# Patient Record
Sex: Male | Born: 1960 | Race: Black or African American | Hispanic: No | Marital: Single | Smoking: Current every day smoker
Health system: Southern US, Community
[De-identification: ages and names within clinical notes are randomized; demographics above are authoritative.]

## PROBLEM LIST (undated history)

## (undated) HISTORY — PX: NO PAST SURGERIES: SHX2092

---

## 2000-12-15 ENCOUNTER — Encounter: Payer: Self-pay | Admitting: Infectious Diseases

## 2000-12-15 ENCOUNTER — Ambulatory Visit (HOSPITAL_COMMUNITY): Admission: RE | Admit: 2000-12-15 | Discharge: 2000-12-15 | Payer: Self-pay | Admitting: Infectious Diseases

## 2014-09-26 ENCOUNTER — Ambulatory Visit: Payer: Self-pay | Admitting: Physician Assistant

## 2017-09-29 ENCOUNTER — Encounter: Payer: Self-pay | Admitting: Podiatry

## 2017-09-29 ENCOUNTER — Ambulatory Visit (INDEPENDENT_AMBULATORY_CARE_PROVIDER_SITE_OTHER): Payer: 59

## 2017-09-29 ENCOUNTER — Ambulatory Visit (INDEPENDENT_AMBULATORY_CARE_PROVIDER_SITE_OTHER): Payer: 59 | Admitting: Podiatry

## 2017-09-29 ENCOUNTER — Other Ambulatory Visit: Payer: Self-pay | Admitting: Podiatry

## 2017-09-29 DIAGNOSIS — G5791 Unspecified mononeuropathy of right lower limb: Secondary | ICD-10-CM | POA: Diagnosis not present

## 2017-09-29 DIAGNOSIS — M7741 Metatarsalgia, right foot: Secondary | ICD-10-CM | POA: Diagnosis not present

## 2017-09-29 DIAGNOSIS — M779 Enthesopathy, unspecified: Secondary | ICD-10-CM

## 2017-09-29 MED ORDER — GABAPENTIN 100 MG PO CAPS: 100.0000 mg | ORAL_CAPSULE | Freq: Two times a day (BID) | ORAL | 1 refills | Status: DC

## 2017-09-29 NOTE — Progress Notes (Signed)
   Subjective:    Patient ID: Stanley Leveringavid Lee Fedder Jr., male    DOB: April 05, 1961, 57 y.o.   MRN: [id number]  HPI    Review of Systems  All other systems reviewed and are negative.      Objective:   Physical Exam        Assessment & Plan:

## 2017-09-30 NOTE — Progress Notes (Signed)
HPI: 57 year old male presenting today as a new patient with a chief complaint of shooting pain to the right great toe and right foot that began 2-3 weeks ago. He reports associated numbness to the foot. He wears steel-toed shoes which exacerbates the pain. He has not done anything to treat the symptoms. Patient is here for further evaluation and treatment.   No past medical history on file.   Physical Exam: General: The patient is alert and oriented x3 in no acute distress.  Dermatology: Skin is warm, dry and supple bilateral lower extremities. Negative for open lesions or macerations.  Vascular: Palpable pedal pulses bilaterally. No edema or erythema noted. Capillary refill within normal limits.  Neurological: Numbness with paresthesia noted to right medial foot.   Musculoskeletal Exam: Range of motion within normal limits to all pedal and ankle joints bilateral. Muscle strength 5/5 in all groups bilateral.   Radiographic Exam:  Normal osseous mineralization. Joint spaces preserved. No fracture/dislocation/boney destruction.    Assessment: - Neuritis right forefoot   Plan of Care:  - Patient evaluated. X-Rays reviewed.  - offloading met pads dispensed.  - prescription for gabapentin 100 mg #60 provided to patient.  - Return to clinic in 4 weeks.   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     Edrick Kins, DPM [facility]  Dr. Edrick Kins, DPM    [address], [place] [address]                Office [phone]  Fax [phone]

## 2017-10-01 ENCOUNTER — Encounter: Payer: Self-pay | Admitting: Podiatry

## 2017-10-01 NOTE — Progress Notes (Signed)
Patient called on call for concern for "infection" his foot. Saw Dr. Logan BoresEvans a few days ago and reports worsening symptoms since then. Reports sharp pain in his foot. Reports swelling in his toes. Endorses numbness in the foot. Denies any open wounds. Denies nausea vomiting fevers and chills. States that the pain wakes him up at night.   Advised that in my opinion antibiotics indicated at this time. Will facilitate appointment for a n earlier follow up. Advised to call the office should his symptoms worsen, he notice any open wounds, he develops nausea vomiting fevers or chills.

## 2017-10-02 ENCOUNTER — Telehealth: Payer: Self-pay | Admitting: *Deleted

## 2017-10-02 ENCOUNTER — Telehealth: Payer: Self-pay | Admitting: Podiatry

## 2017-10-02 MED ORDER — MELOXICAM 15 MG PO TABS: 15.0000 mg | ORAL_TABLET | Freq: Every day | ORAL | 3 refills | Status: DC

## 2017-10-02 MED ORDER — METHYLPREDNISOLONE 4 MG PO TABS: ORAL_TABLET | ORAL | 0 refills | Status: DC

## 2017-10-02 NOTE — Telephone Encounter (Signed)
I spoke with pt and he states he is taking the pills, but it is not getting any relief. I told pt Dr. Logan BoresEvans is out of the office next week and I would like to get him in to [facility] early next week. Transferred pt to schedulers.

## 2017-10-02 NOTE — Telephone Encounter (Signed)
Returned call to patient and informed him that Dr. Logan BoresEvans wants him to try a Medrol dose pack and Meloxicam to help calm down inflammation. I informed him that if no better after treatment, he can call back and we can look at other treatment options.  Patient voice understanding.   Scripts have been sent to pharmacy.

## 2017-10-02 NOTE — Telephone Encounter (Signed)
-----   Message from Park LiterMichael J Price, DPM sent at 10/01/2017  8:49 PM EDT ----- Regarding: earlier appt Can we call patient to check on him tomorrow and to get him a sooner appointment with Dr. Logan BoresEvans next week?  Thanks!

## 2017-10-02 NOTE — Telephone Encounter (Signed)
Patient called the office stating he is having severe shooting pains in his foot, just saw Dr. Logan BoresEvans Tuesday but the medication he was given is not helping with his pain. Pt would like something else called into the pharmacy or wants to know if he needs to be seen again? Please call.

## 2017-10-02 NOTE — Addendum Note (Signed)
Addended by: Geraldine ContrasVENABLE, Alveta Quintela D on: 10/02/2017 01:51 PM   Modules accepted: Orders

## 2017-10-07 ENCOUNTER — Ambulatory Visit (INDEPENDENT_AMBULATORY_CARE_PROVIDER_SITE_OTHER): Payer: 59 | Admitting: Podiatry

## 2017-10-07 ENCOUNTER — Encounter: Payer: Self-pay | Admitting: Podiatry

## 2017-10-07 DIAGNOSIS — M7741 Metatarsalgia, right foot: Secondary | ICD-10-CM | POA: Diagnosis not present

## 2017-10-07 DIAGNOSIS — G5791 Unspecified mononeuropathy of right lower limb: Secondary | ICD-10-CM | POA: Diagnosis not present

## 2017-10-08 NOTE — Progress Notes (Signed)
Subjective:   Patient ID: Stanley Leveringavid Lee Luth Jr., male   DOB: 57 y.o.   MRN: [id number]   HPI- Patient presents stating that he was concerned because he continues to get tingling in his right big toe and into the arch and he just started taking the gabapentin finish the Medrol Dosepak    ROS      Objective:  Physical Exam  Neurovascular status intact with patient's right foot showing tingling symptoms but no indications of inflammatory condition or any form of hallux limitus condition     Assessment:  Appears to be more of an inflammatory condition with the possibility of mild neuropathy     Plan:  Reviewed his medications and I do think he needs to take his gabapentin for a longer period of time before we will see effect.  I have also asked him to double it up at night and will keep regular appointment with Dr. Logan BoresEvans

## 2017-10-14 DIAGNOSIS — G5791 Unspecified mononeuropathy of right lower limb: Secondary | ICD-10-CM | POA: Diagnosis not present

## 2017-10-16 DIAGNOSIS — R030 Elevated blood-pressure reading, without diagnosis of hypertension: Secondary | ICD-10-CM | POA: Diagnosis not present

## 2017-10-27 ENCOUNTER — Ambulatory Visit: Payer: 59 | Admitting: Podiatry

## 2017-11-27 ENCOUNTER — Telehealth: Payer: Self-pay | Admitting: *Deleted

## 2017-11-27 MED ORDER — GABAPENTIN 100 MG PO CAPS: ORAL_CAPSULE | ORAL | 5 refills | Status: DC

## 2017-11-27 NOTE — Telephone Encounter (Signed)
Refill request for gabapentin  bid. Dr. Charlsie Merles ordered on 10/07/2017 for pt to take Gabapentin  one tablet bid and 2 tablets ( ) at bedtime. Dr. Charlsie Merles ordered refill for #90, +5refills.

## 2017-11-27 NOTE — Addendum Note (Signed)
Addended by: Alphia Kava D on: 11/27/2017 09:43 AM   Modules accepted: Orders

## 2018-11-03 DIAGNOSIS — L97521 Non-pressure chronic ulcer of other part of left foot limited to breakdown of skin: Secondary | ICD-10-CM | POA: Diagnosis not present

## 2018-11-03 DIAGNOSIS — M2042 Other hammer toe(s) (acquired), left foot: Secondary | ICD-10-CM | POA: Diagnosis not present

## 2018-11-03 DIAGNOSIS — M898X9 Other specified disorders of bone, unspecified site: Secondary | ICD-10-CM | POA: Diagnosis not present

## 2019-08-16 ENCOUNTER — Ambulatory Visit: Payer: BC Managed Care – PPO | Attending: Internal Medicine

## 2019-08-16 DIAGNOSIS — Z20822 Contact with and (suspected) exposure to covid-19: Secondary | ICD-10-CM

## 2019-08-17 LAB — NOVEL CORONAVIRUS, NAA: SARS-CoV-2, NAA: NOT DETECTED

## 2019-10-09 ENCOUNTER — Ambulatory Visit: Payer: BC Managed Care – PPO | Attending: Internal Medicine

## 2019-10-09 DIAGNOSIS — Z23 Encounter for immunization: Secondary | ICD-10-CM

## 2019-10-09 NOTE — Progress Notes (Signed)
   [id number]  Name:  Stanley Barajas.    MRN: [id number] DOB: Apr 10, 1961  10/09/2019  Stanley Barajas was observed post Covid-19 immunization for 15 minutes without incident. He was provided with Vaccine Information Sheet and instruction to access the V-Safe system.   Stanley Barajas was instructed to call 911 with any severe reactions post vaccine: Marland Kitchen Difficulty breathing  . Swelling of face and throat  . A fast heartbeat  . A bad rash all over body  . Dizziness and weakness   Immunizations Administered    Name Date Dose VIS Date Route   Pfizer COVID-19 Vaccine 10/09/2019  9:07 AM 0.3 mL 07/01/2019 Intramuscular   Manufacturer: [employer], [employer]   Lot: [id number]   NDC: [address]-2

## 2019-10-30 ENCOUNTER — Ambulatory Visit: Payer: BC Managed Care – PPO | Attending: Internal Medicine

## 2019-10-30 DIAGNOSIS — Z23 Encounter for immunization: Secondary | ICD-10-CM

## 2019-10-30 NOTE — Progress Notes (Signed)
   [id number]  Name:  Stanley Barajas.    MRN: [id number] DOB: 01/12/1961  10/30/2019  Mr. Stanley Barajas was observed post Covid-19 immunization for 15 minutes without incident. He was provided with Vaccine Information Sheet and instruction to access the V-Safe system.   Mr. Stanley Barajas was instructed to call 911 with any severe reactions post vaccine: Marland Kitchen Difficulty breathing  . Swelling of face and throat  . A fast heartbeat  . A bad rash all over body  . Dizziness and weakness   Immunizations Administered    Name Date Dose VIS Date Route   Pfizer COVID-19 Vaccine 10/30/2019  9:03 AM 0.3 mL 07/01/2019 Intramuscular   Manufacturer: [employer], [employer]   Lot: [id number]   NDC: [address]-2

## 2019-10-31 ENCOUNTER — Ambulatory Visit
Admission: EM | Admit: 2019-10-31 | Discharge: 2019-10-31 | Disposition: A | Discharge status: Home / Self Care | Payer: BC Managed Care – PPO | Attending: Urgent Care | Admitting: Urgent Care

## 2019-10-31 ENCOUNTER — Other Ambulatory Visit: Payer: Self-pay

## 2019-10-31 ENCOUNTER — Ambulatory Visit (INDEPENDENT_AMBULATORY_CARE_PROVIDER_SITE_OTHER): Payer: BC Managed Care – PPO

## 2019-10-31 ENCOUNTER — Encounter: Payer: Self-pay | Admitting: Emergency Medicine

## 2019-10-31 DIAGNOSIS — M79642 Pain in left hand: Secondary | ICD-10-CM

## 2019-10-31 DIAGNOSIS — S62617A Displaced fracture of proximal phalanx of left little finger, initial encounter for closed fracture: Secondary | ICD-10-CM

## 2019-10-31 DIAGNOSIS — W19XXXA Unspecified fall, initial encounter: Secondary | ICD-10-CM | POA: Diagnosis not present

## 2019-10-31 MED ORDER — HYDROCODONE-ACETAMINOPHEN 5-325 MG PO TABS: 1.0000 | ORAL_TABLET | Freq: Three times a day (TID) | ORAL | 0 refills | Status: AC | PRN

## 2019-10-31 NOTE — ED Triage Notes (Signed)
Pt c/o left pinky pain, swelling and bruising. He states that he tripped and fell about 3 days ago. He states that he "popped" it back in.

## 2019-10-31 NOTE — Discharge Instructions (Addendum)
It was very nice seeing you today in clinic. Thank you for entrusting me with your care.   Rest, ice, and elevate hand. Wear finger splint until seen by orthopedics.   Make arrangements to follow up with orthopedic doctor. I have provided you the name and office contact information for an excellent local provider. If your symptoms/condition worsens, please seek follow up care either here or in the ER. Please remember, our [facility] providers are "right here with you" when you need [place].   Again, it was my pleasure to take care of you today. Thank you for choosing our clinic. I hope that you start to feel better quickly.   Quentin Mulling, MSN, APRN, FNP-C, CEN Advanced Practice Provider [facility]

## 2019-10-31 NOTE — ED Provider Notes (Signed)
[place], [place]   Name: Julie Paolini. DOB: 11/27/1960 MRN: [id number] CSN: [id number] PCP: [facility] date and time:  10/31/19 1113  Chief Complaint:  Finger Injury  NOTE: Prior to seeing the patient today, I have reviewed the triage nursing documentation and vital signs. Clinical staff has updated patient's PMH/PSHx, current medication list, and drug allergies/intolerances to ensure comprehensive history available to assist in medical decision making.   History:   HPI: Maya Scholer. is a 59 y.o. male who presents today with complaints of pain in the 5th digit of his LEFT hand following a mechanical fall that occurred approximately 3 days ago. Patient describes that injury occurred when he tripped on some uneven pavement causing him to fall to the ground. Fall resulted in obvious dislocation of the finger pr his report. Patient states, "I had to pop it back in place". Since his fall, patient reports continue pain, swelling, and ecchymosis in the digit and lateral (ulnar) aspect of his hand. Patient has limited ROM and intact sensation. He has a PMH (+) for remote hand fracture, none of which required surgical intervention. In efforts to conservatively manage his symptoms at home, the patient notes that he has used APAP, which has not helped to improve his symptoms.   History reviewed. No pertinent past medical history.  Past Surgical History:  Procedure Laterality Date  . NO PAST SURGERIES      Family History  Problem Relation Age of Onset  . Heart failure Mother   . Cancer Father     Social History   Tobacco Use  . Smoking status: Current Every Day Smoker    Packs/day: 0.25    Types: Cigarettes  . Smokeless tobacco: Never Used  Substance Use Topics  . Alcohol use: Yes    Alcohol/week: 4.0 - 5.0 standard drinks    Types: 4 - 5 Cans of beer per week  . Drug use: No    There are no problems to display for this patient.   Home  Medications:    No outpatient medications have been marked as taking for the 10/31/19 encounter [facility] Encounter).    Allergies:   Patient has no known allergies.  Review of Systems (ROS):  Review of systems NEGATIVE unless otherwise noted in narrative H&P section.   Vital Signs: Today's Vitals   10/31/19 1125 10/31/19 1126 10/31/19 1129  BP:   135/88  Pulse:   99  Resp:   18  Temp:   98.3 F (36.8 C)  TempSrc:   Oral  SpO2:   99%  Weight:  180 lb (81.6 kg)   Height:  5\' 11"  (1.803 m)   PainSc: 8       Physical Exam: Physical Exam  Constitutional: He is oriented to person, place, and time and well-developed, well-nourished, and in no distress.  HENT:  Head: Normocephalic and atraumatic.  Eyes: Pupils are equal, round, and reactive to light.  Cardiovascular: Normal rate, regular rhythm, normal heart sounds and intact distal pulses.  Pulmonary/Chest: Effort normal and breath sounds normal.  Musculoskeletal:     Left hand: Swelling and bony tenderness present. No deformity. Decreased range of motion (2/2 pain and swelling). Normal strength. Normal sensation. There is no disruption of two-point discrimination.       Hands:     Comments: Pain, swelling, and ecchymosis noted over the medial third of the LEFT 5th digit. ROM is limited due to pain and swelling. Sensation, temperature, and  capillary refill all WNL.   Neurological: He is alert and oriented to person, place, and time. Gait normal.  Skin: Skin is warm and dry. No rash noted. He is not diaphoretic.  Psychiatric: Mood, memory, affect and judgment normal.  Nursing note and vitals reviewed.   Urgent Care Treatments / Results:   Orders Placed This Encounter  Procedures  . DG Hand Complete Left  . Apply finger splint static    LABS: PLEASE NOTE: all labs that were ordered this encounter are listed, however only abnormal results are displayed. Labs Reviewed - No data to display  EKG: -None  RADIOLOGY: DG  Hand Complete Left  Result Date: 10/31/2019 CLINICAL DATA:  Pain and swelling fifth finger post fall 2 nights ago. EXAM: LEFT HAND - COMPLETE 3+ VIEW COMPARISON:  None. FINDINGS: There is an oblique slightly comminuted minimally displaced fracture involving the fifth middle phalanx slight dorsal angulation of the distal fragment. IMPRESSION: Minimally displaced and slightly comminuted fracture involving the fifth middle phalanx. Electronically Signed   By: Elberta Fortis M.D.   On: 10/31/2019 11:47    PROCEDURES: Procedures  MEDICATIONS RECEIVED THIS VISIT: Medications - No data to display  PERTINENT CLINICAL COURSE NOTES/UPDATES:   Initial Impression / Assessment and Plan / Urgent Care Course:  Pertinent labs & imaging results that were available during my care of the patient were personally reviewed by me and considered in my medical decision making (see lab/imaging section of note for values and interpretations).  Dalen [place]enn[place]. is a 59 y.o. male who presents to [facility] today with complaints of Finger Injury  Patient is well appearing overall in clinic today. He does not appear to be in any acute distress. Presenting symptoms (see HPI) and exam as documented above. Exam concerning for fracture based on appearance and MOI. Patient reports obvious dislocation at time of injury that he "popped back in". Will pursue further evaluation as follows:   Diagnostic radiographs of the LEFT hand revealed a minimally displaced and slightly comminuted fracture of the fifth middle phalanx.   Results reviewed with patient. OCL splint applied by clinic CMA in efforts to immobilize the digit. Discussed that patient needs to be seen for further evaluation by orthopedics. I took the liberty of calling [facility] orthopedics while the patient was in the office to secure him an appointment. Receptionist (Patty) to speak with orthopedist on call and contact the patient directly for an  appointment.  Current clinical condition warrants patient being out of work in order to recover from his current injury/illness. He was provided with the appropriate documentation to provide to his place of employment that will allow for him to RTW on 11/04/2019 as long as splint remains in place. Discussed that he will need to wear the finger splint until directed otherwise by orthopedic provider.   I have reviewed the follow up and strict return precautions for any new or worsening symptoms. Patient is aware of symptoms that would be deemed urgent/emergent, and would thus require further evaluation either here or in the emergency department. At the time of discharge, he verbalized understanding and consent with the discharge plan as it was reviewed with him. All questions were fielded by provider and/or clinic staff prior to patient discharge.    Final Clinical Impressions / Urgent Care Diagnoses:   Final diagnoses:  Displaced fracture of proximal phalanx of left little finger, initial encounter for closed fracture    New Prescriptions:  [place] Controlled Substance Registry  consulted? Yes, I have consulted the [place] Controlled Substances Registry for this patient, and feel the risk/benefit ratio today is favorable for proceeding with this prescription for a controlled substance.  . Discussed use of controlled substance medication to treat his acute pain.  o Reviewed [place] STOP Act regulations  o Clinic does not refill controlled substances over the phone without face to face evaluation.  . Safety precautions reviewed.  o Medications should not be bitten, chewed, sold, or taken with alcohol.  o Avoid use while working, driving, or operating heavy machinery.  o Side effects associated with the use of this particular medication reviewed. - Patient understands that this medication can cause CNS depression, increase his risk of falls, and even lead to overdose that may result in death, if used outside of  the parameters that he and I discussed.  With all of this in mind, he knowingly accepts the risks and responsibilities associated with intended course of treatment, and elects to responsibly proceed as discussed.  Meds ordered this encounter  Medications  . HYDROcodone-acetaminophen (NORCO) 5-325 MG tablet    Sig: Take 1 tablet by mouth 3 (three) times daily as needed for moderate pain.    Dispense:  12 tablet    Refill:  0    Recommended Follow up Care:  Patient encouraged to follow up with the following provider within the specified time frame, or sooner as dictated by the severity of his symptoms. As always, he was instructed that for any urgent/emergent care needs, he should seek care either here or in the emergency department for more immediate evaluation.  Follow-up Information    [facility]   Why: Need to see orthopedics. I have asked that they call you with an appointment. Contact information: [address] [place] [address] [phone]         NOTE: This note was prepared using Dragon dictation software along with smaller phrase technology. Despite my best ability to proofread, there is the potential that transcriptional errors may still occur from this process, and are completely unintentional.    Verlee Monte, NP 10/31/19 1425

## 2020-12-07 IMAGING — CR DG HAND COMPLETE 3+V*L*
3 series · 4 of 4 positions shown · non-contrast
Comparison: None.

CLINICAL DATA: Pain and swelling fifth finger post fall 2 nights
ago.

EXAM:
LEFT HAND - COMPLETE 3+ VIEW

[hand ap]
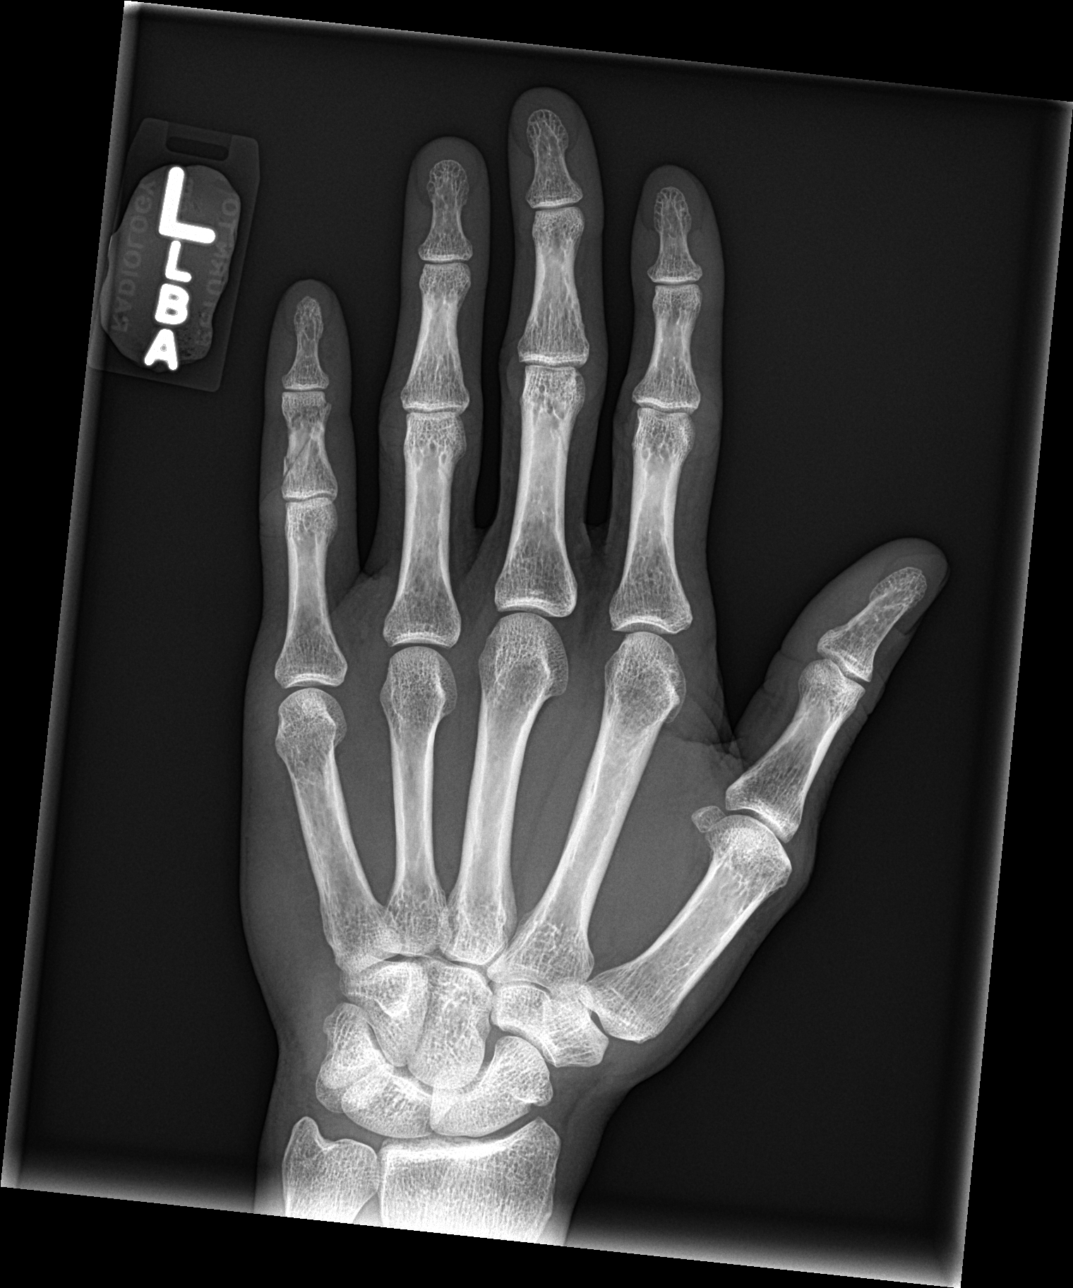

[hand obl]
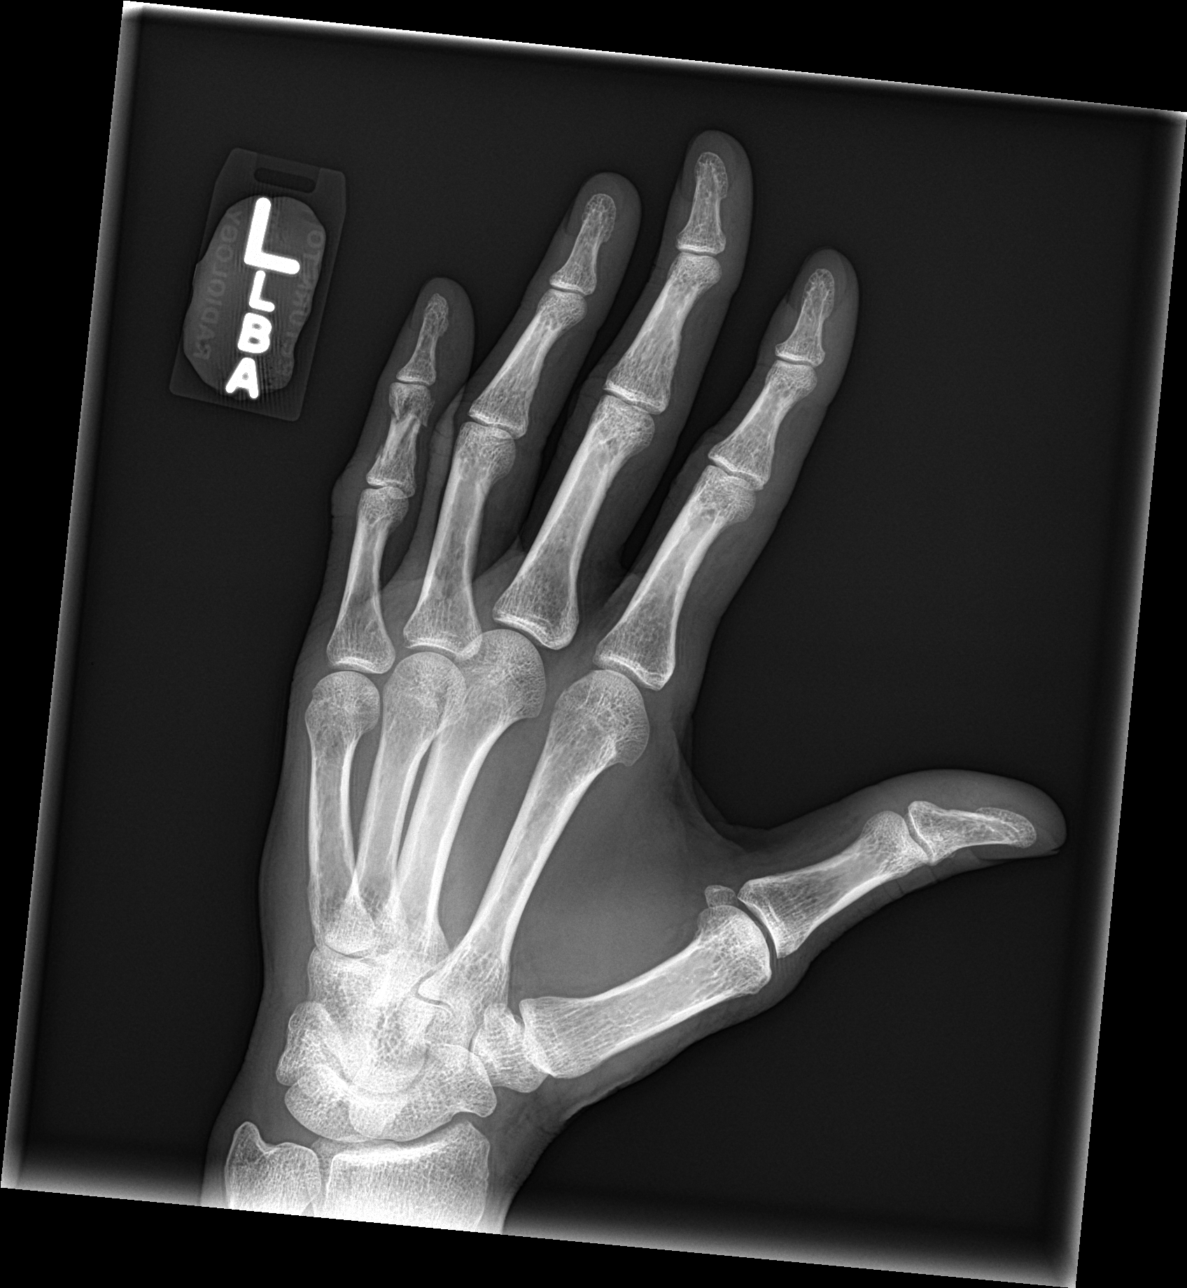

[Series 3: hand lat · 0.14mm/px · 2 of 2 slices shown]
[im 1/2]
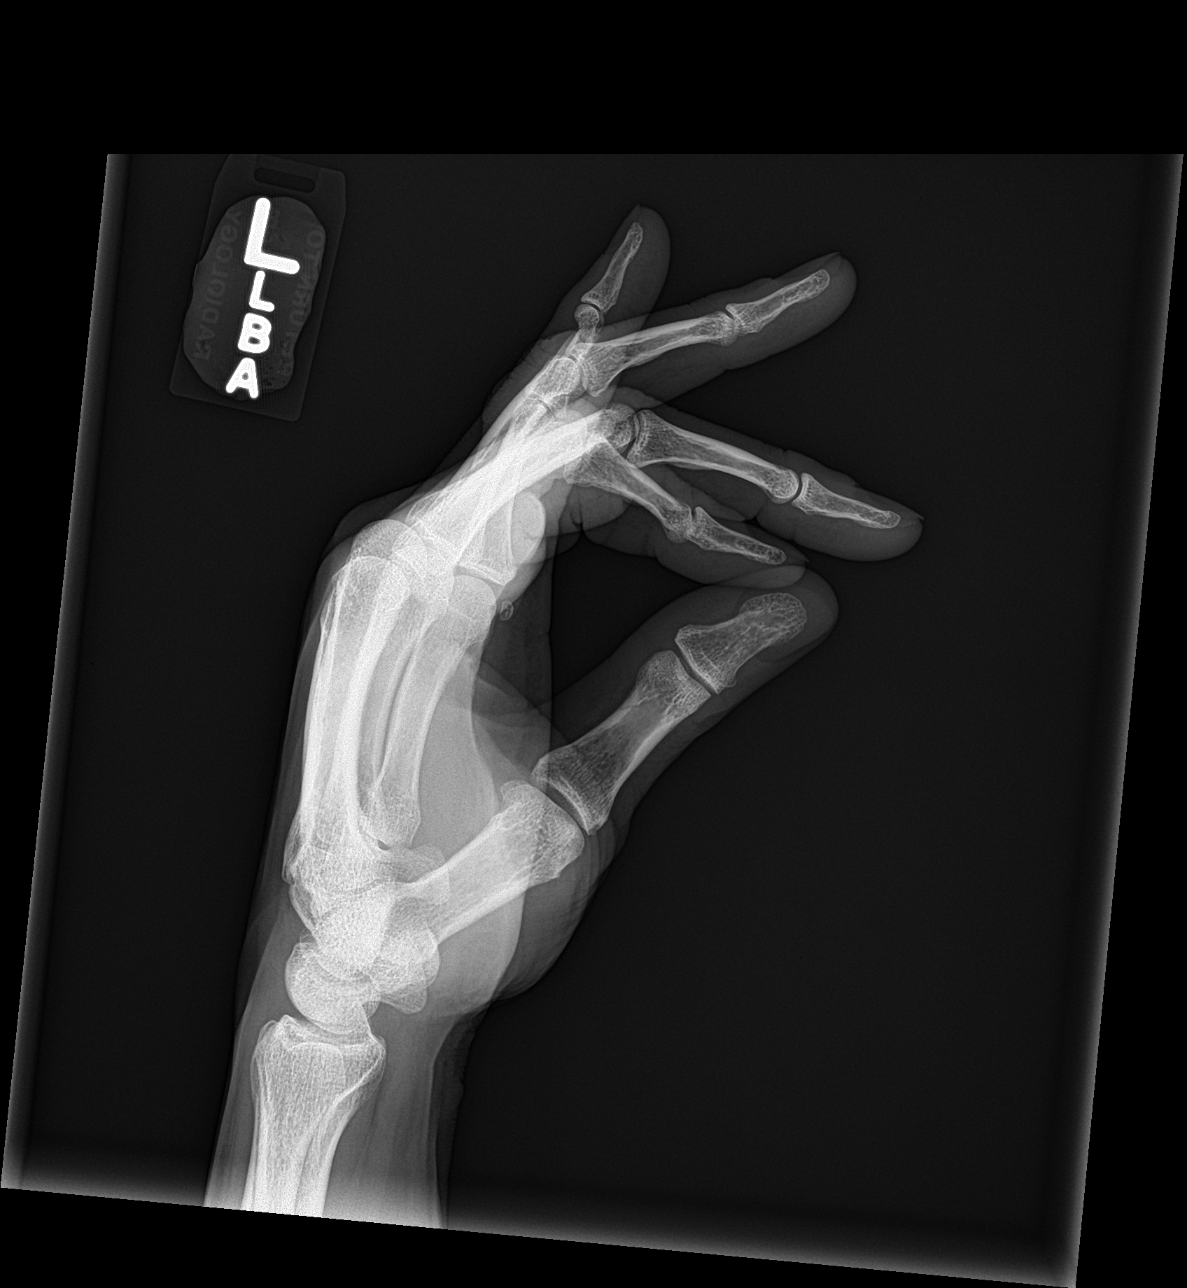
[im 2/2]
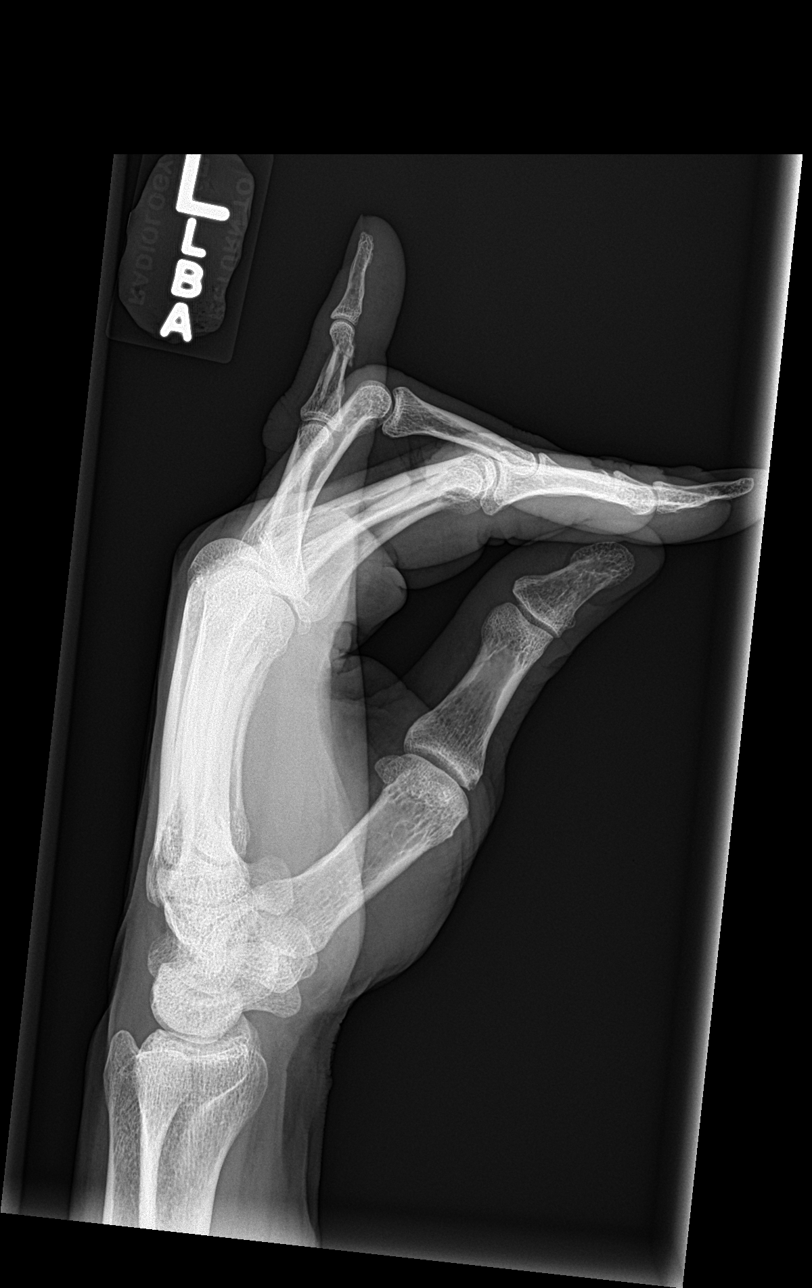

[4 of 4 positions shown; findings below may reference images not displayed]

FINDINGS: There is an oblique slightly comminuted minimally displaced fracture
involving the fifth middle phalanx slight dorsal angulation of the
distal fragment.
IMPRESSION: Minimally displaced and slightly comminuted fracture involving the
fifth middle phalanx.

## 2023-04-28 ENCOUNTER — Ambulatory Visit: Payer: BC Managed Care – PPO

## 2023-10-15 LAB — COLOGUARD: COLOGUARD: NEGATIVE

## 2024-04-11 ENCOUNTER — Emergency Department

## 2024-04-11 ENCOUNTER — Encounter: Payer: Self-pay | Admitting: Emergency Medicine

## 2024-04-11 ENCOUNTER — Other Ambulatory Visit: Payer: Self-pay

## 2024-04-11 DIAGNOSIS — Z5321 Procedure and treatment not carried out due to patient leaving prior to being seen by health care provider: Secondary | ICD-10-CM | POA: Diagnosis not present

## 2024-04-11 DIAGNOSIS — R002 Palpitations: Secondary | ICD-10-CM | POA: Insufficient documentation

## 2024-04-11 DIAGNOSIS — R079 Chest pain, unspecified: Secondary | ICD-10-CM | POA: Diagnosis present

## 2024-04-11 LAB — BASIC METABOLIC PANEL WITH GFR
Anion gap: 13 (ref 5–15)
BUN: 13 mg/dL (ref 6–20)
CO2: 22 mmol/L (ref 22–32)
Calcium: 8.9 mg/dL (ref 8.9–10.3)
Chloride: 105 mmol/L (ref 98–111)
Creatinine, Ser: 1.1 mg/dL (ref 0.61–1.24)
GFR, Estimated: >60 mL/min (ref >60)
Glucose, Bld: 92 mg/dL (ref 70–99)
Potassium: 3.5 mmol/L (ref 3.5–5.1)
Sodium: 140 mmol/L (ref 135–145)

## 2024-04-11 LAB — CBC
HCT: 38.5 % — ABNORMAL LOW (ref 39.0–52.0)
Hemoglobin: 13.3 g/dL (ref 13.0–17.0)
MCH: 34.3 pg — ABNORMAL HIGH (ref 26.0–34.0)
MCHC: 34.5 g/dL (ref 30.0–36.0)
MCV: 99.2 fL (ref 80.0–100.0)
Platelets: 253 K/uL (ref 150–400)
RBC: 3.88 MIL/uL — ABNORMAL LOW (ref 4.22–5.81)
RDW: 13.8 % (ref 11.5–15.5)
WBC: 6.9 K/uL (ref 4.0–10.5)
nRBC: 0 % (ref 0.0–0.2)

## 2024-04-11 LAB — TROPONIN I (HIGH SENSITIVITY): Troponin I (High Sensitivity): 4 ng/L (ref <18)

## 2024-04-11 NOTE — ED Triage Notes (Signed)
 Pt presents to the ED via POV with complaints of L sided CP x several months. He notes no pain at the present but has had some palpations tonight. No meds taken PTA. A&Ox4 at this time. Denies SOB.

## 2024-04-12 ENCOUNTER — Emergency Department
Admission: EM | Admit: 2024-04-12 | Discharge: 2024-04-12 | Discharge status: Against Medical Advice | Attending: Emergency Medicine | Admitting: Emergency Medicine
